# Patient Record
Sex: Female | Born: 1965 | Hispanic: Yes | Marital: Married | Smoking: Never smoker
Health system: Southern US, Community
[De-identification: ages and names within clinical notes are randomized; demographics above are authoritative.]

---

## 2006-08-07 ENCOUNTER — Ambulatory Visit: Payer: Self-pay | Admitting: Obstetrics & Gynecology

## 2006-08-13 ENCOUNTER — Ambulatory Visit: Payer: Self-pay | Admitting: Obstetrics & Gynecology

## 2017-05-20 ENCOUNTER — Ambulatory Visit: Payer: Self-pay | Attending: Oncology

## 2017-11-06 ENCOUNTER — Inpatient Hospital Stay
Admission: RE | Admit: 2017-11-06 | Discharge: 2017-11-06 | Disposition: A | Discharge status: Home / Self Care | Payer: Self-pay | Source: Ambulatory Visit | Attending: *Deleted | Admitting: *Deleted

## 2017-11-06 ENCOUNTER — Other Ambulatory Visit: Payer: Self-pay | Admitting: *Deleted

## 2017-11-06 DIAGNOSIS — Z9289 Personal history of other medical treatment: Secondary | ICD-10-CM

## 2017-12-16 ENCOUNTER — Ambulatory Visit
Admission: RE | Admit: 2017-12-16 | Discharge: 2017-12-16 | Disposition: A | Discharge status: Home / Self Care | Payer: Self-pay | Source: Ambulatory Visit | Attending: Oncology | Admitting: Oncology

## 2017-12-16 ENCOUNTER — Encounter (INDEPENDENT_AMBULATORY_CARE_PROVIDER_SITE_OTHER): Payer: Self-pay

## 2017-12-16 ENCOUNTER — Encounter: Payer: Self-pay | Admitting: *Deleted

## 2017-12-16 ENCOUNTER — Other Ambulatory Visit: Payer: Self-pay

## 2017-12-16 ENCOUNTER — Other Ambulatory Visit: Payer: Self-pay | Admitting: Oncology

## 2017-12-16 ENCOUNTER — Ambulatory Visit: Payer: Self-pay | Attending: Oncology | Admitting: *Deleted

## 2017-12-16 VITALS — BP 160/91 | HR 61 | Temp 98.2°F | Ht 62.0 in | Wt 178.3 lb

## 2017-12-16 DIAGNOSIS — Z0000 Encounter for general adult medical examination without abnormal findings: Secondary | ICD-10-CM

## 2017-12-16 DIAGNOSIS — C50919 Malignant neoplasm of unspecified site of unspecified female breast: Secondary | ICD-10-CM

## 2017-12-16 DIAGNOSIS — R920 Mammographic microcalcification found on diagnostic imaging of breast: Secondary | ICD-10-CM

## 2017-12-16 DIAGNOSIS — N63 Unspecified lump in unspecified breast: Secondary | ICD-10-CM

## 2017-12-16 NOTE — Patient Instructions (Signed)
Prueba del VPH HPV Test La prueba del virus del papiloma humano (VPH) se [place] para Hydrographic surveyor los tipos de infeccin por el VPH de Public affairs consultant. El VPH es un grupo de alrededor de 100 virus. Muchos de estos virus causan tumores dentro de los genitales, sobre ellos o a su alrededor. La mayora de los VPH provocan infecciones que suelen desaparecen sin tratamiento. Sin embargo, los tipos 6, 2, 59 y 33 del VPH se consideran de Public affairs consultant y pueden aumentar el riesgo de Chief Financial Officer de cuello del tero o de ano si la infeccin no se trata. La prueba del VPH identifica las cadenas de ADN (genticas) de la infeccin por el VPH, por lo que tambin se denomina prueba de [place] para [employer]. Aunque el VPH se Secretary/administrator en los hombres como en las mujeres, la prueba del VPH se [place] solo para Hydrographic surveyor un mayor riesgo de cncer en las mujeres:  Con una prueba de Papanicolaou anormal.  Despus del tratamiento de una prueba de Papanicolaou anormal.  Entre 30y 65aos.  Despus del tratamiento de una infeccin por el VPH de [employer].  La prueba del VPH se puede hacer al mismo tiempo que un examen plvico y Ardelia Mems prueba de Papanicolaou en mujeres de ms de 30 aos. Tanto la prueba del VPH como la prueba de Papanicolaou requieren Truddie Coco de clulas del cuello del tero. Cmo debo prepararme para esta prueba?  No se haga lavados vaginales ni se d un bao durante 24 a 48 horas antes de la prueba o como se lo haya indicado el mdico.  No tenga sexo durante 24 a 48 horas antes de la prueba o como se lo haya indicado el mdico.  Es posible que se le pida que reprograme la prueba si est menstruando.  Se le pedir que orine antes de la prueba. [place] significan los resultados? Es su responsabilidad retirar el resultado del [place]. Consulte en el laboratorio o en el departamento en el que fue realizado el estudio cundo y cmo podr [employer]. Hable con el mdico si tiene [employer]. El resultado ser negativo o positivo. Significado de los [employer] del anlisis Un resultado negativo de la prueba del VPH significa que no se detect el VPH, y es muy probable que no tenga el virus. Significado de [employer] positivos del anlisis Un resultado positivo de la prueba del VPH indica que tiene el virus.  Si el resultado de la prueba [place] la presencia de [place] cadena del VPH de alto riesgo, puede tener mayor riesgo de Chief Financial Officer de cuello del tero o de ano si la infeccin no se trata.  Si se encuentran cadenas del VPH de bajo riesgo, es poco probable que tenga un alto riesgo de Chief Financial Officer.  Hable con el mdico [employer]. El mdico [employer] para Optometrist un diagnstico y Teacher, adult education un plan de tratamiento adecuado para usted. Hable con el mdico [employer], las opciones de tratamiento y, si es necesario, la necesidad de Optometrist ms [place]. Hable con el mdico si tiene [employer]. Esta informacin no tiene Marine scientist el consejo del mdico. Asegrese de hacerle al mdico cualquier pregunta que tenga. Document Released: 07/10/2008 Document Revised: 06/12/2016 Document Reviewed: 08/09/2013 Elsevier Interactive Patient Education  2018 [employer].

## 2017-12-16 NOTE — Progress Notes (Signed)
  Subjective:     Patient ID: Anita Romero, female   DOB: [birth year], 52 y.o.   MRN: [id number]  HPI   Review of Systems     Objective:   Physical Exam  Pulmonary/Chest: Right breast exhibits mass. Right breast exhibits no inverted nipple, no nipple discharge, no skin change and no tenderness. Left breast exhibits no inverted nipple, no mass, no nipple discharge, no skin change and no tenderness.    Abdominal: There is no splenomegaly or hepatomegaly.  Genitourinary: No labial fusion. There is no rash, tenderness, lesion or injury on the right labia. There is no rash, tenderness, lesion or injury on the left labia. Cervix exhibits friability. Cervix exhibits no motion tenderness and no discharge. Right adnexum displays no mass, no tenderness and no fullness. Left adnexum displays no mass, no tenderness and no fullness. No erythema, tenderness or bleeding in the vagina. No foreign body in the vagina. No signs of injury around the vagina. No vaginal discharge found.         Assessment:     52 year old Hispanic female referred by [facility] for additional views and ultrasound of bilateral breast.  Recent mammogram at [facility] was a birads 0 showing bilateral calcifications.  Maritza, the interpreter present during the interview and exam.  On clinical breast exam I can palpate an approximate 2 cm mobile soft nodule at 6:00 retroareolar right breast.  Patient states she had noticed the area of concern and had had her husband feel the area also.  Taught self breast awareness.  Last pap on 03/14/15 was negative without HPV co-testing.  Specimen collected for pap smear without difficulty.  Patient has been screened for eligibility.  She does not have any insurance, Medicare or Medicaid.  She also meets financial eligibility.  Hand-out given on the Affordable Care Act. Risk Assessment    Risk Scores      12/16/2017   Last edited by: Theodore Demark, RN   5-year risk: 0.5 %   Lifetime risk: 4 %            Plan:     Bilateral diagnostic mammogram and ultrasound ordered for follow-up of bilateral calcifications and right breast nodule.  Will follow-up per [facility] protocol.

## 2017-12-23 LAB — PAP LB AND HPV HIGH-RISK
HPV, high-risk: NEGATIVE
PAP Smear Comment: 0

## 2017-12-24 ENCOUNTER — Encounter: Payer: Self-pay | Admitting: *Deleted

## 2017-12-24 DIAGNOSIS — R920 Mammographic microcalcification found on diagnostic imaging of breast: Secondary | ICD-10-CM

## 2017-12-24 NOTE — Progress Notes (Addendum)
Called and spoke to patient via Ronnald Collum, the interpreter.  Reviewed results of her normal pap smear and need to return for a 6 month follow up for bilateral calcifications.  Discussed the palpable right breast nodule.   Patient states "It's been there a long time, all the doctors say it's fine".  States it is unchanged.  I have scheduled her to return for a six month follow-up mammogram and breast exam on June 28, 2018 at 8:30 at [facility], and 10:20 at the [facility].   Patient is to call if she notices any changes.  Next pap in 5 years.  HSIS to [place].

## 2018-06-28 ENCOUNTER — Other Ambulatory Visit: Payer: Self-pay

## 2018-10-05 ENCOUNTER — Telehealth: Payer: Self-pay | Admitting: Obstetrics and Gynecology

## 2018-10-05 NOTE — Telephone Encounter (Signed)
Pt came into office lost , Pulled pt chart to help navigate where the pt was supposed to go. Pt did not speak any english as well.

## 2020-08-27 ENCOUNTER — Telehealth: Payer: Self-pay | Admitting: *Deleted

## 2020-08-27 NOTE — Telephone Encounter (Signed)
Patients daughter called stating the patient is requesting to reschedule with [facility] as she feels something under her breast.  The daughter states that if you call the patient [phone]) you will need an interpreter as she does not speak spanish.  If an interpreter is not available, you can call the daughter at [phone] to coordinate scheduling.  Routing to [employer] team for follow up.

## 2020-08-27 NOTE — Telephone Encounter (Signed)
Thank you [name]!  Anita Romero could you call this patient to assess and schedule?  Thank you!!!

## 2020-08-29 ENCOUNTER — Other Ambulatory Visit: Payer: Self-pay

## 2020-08-29 ENCOUNTER — Ambulatory Visit: Payer: Self-pay | Attending: Oncology | Admitting: *Deleted

## 2020-08-29 ENCOUNTER — Encounter: Payer: Self-pay | Admitting: *Deleted

## 2020-08-29 VITALS — BP 160/81 | HR 62 | Temp 97.4°F | Ht 62.0 in | Wt 182.0 lb

## 2020-08-29 DIAGNOSIS — R920 Mammographic microcalcification found on diagnostic imaging of breast: Secondary | ICD-10-CM

## 2020-08-29 NOTE — Progress Notes (Signed)
Subjective:     Patient ID: Anita Romero, female   DOB: June 02, 1965, 55 y.o.   MRN: [id number]  HPI   [facility] Medical History Record - 08/29/20 4098      Breast History   Screening cycle New    CBE Date 08/29/20    Provider ([facility]) [facility]    Initial Mammogram 08/29/20    Last Mammogram Annual    Last Mammogram Date 12/16/17    Recent Breast Symptoms Lump   left axillary inflammation     Breast Cancer History   Breast Cancer History No personal or family history    Comments/Details Patient had aBirads 3 mammogram in 2019, but did not return for follow-up      Previous History of Breast Problems   Breast Surgery or Biopsy None    Breast Implants N/A    BSE Done Monthly      Gynecological/Obstetrical History   LMP --   2020   Is there any chance that the client could be pregnant?  No    Age at menarche 55    Age at menopause 55    PAP smear history Annually    Date of last PAP  12/16/17    Provider (PAP) [facility]  neg/neg    Age at first live birth 38    Breast fed children Yes (type length in comments)   1 year   DES Exposure No    Cervical, Uterine or Ovarian cancer No    Family history of Cervial, Uterine or Ovarian cancer Yes   mother - uterine   Hysterectomy No    Cervix removed No    Ovaries removed No    Laser/Cryosurgery No    Current method of birth control None    Current method of Estrogen/Hormone replacement None    Smoking history None    Comments no insurance             Review of Systems     Objective:   Physical Exam Chest:  Breasts:     Right: No swelling, bleeding, inverted nipple, mass, nipple discharge, skin change, tenderness, axillary adenopathy or supraclavicular adenopathy.     Left: Mass and tenderness present. No swelling, bleeding, inverted nipple, nipple discharge, skin change, axillary adenopathy or supraclavicular adenopathy.     Lymphadenopathy:     Upper Body:     Right upper body: No supraclavicular or axillary adenopathy.      Left upper body: No supraclavicular or axillary adenopathy.        Assessment:     55 year old Hispanic female [facility] to [facility] with complaints of a palpable mass.  Last mammogram on 12/16/17 was a birads 3.  Patient did not return for her 6 month follow up mammogram in March of 2020 at the onset of the Covid 19 Pandemic.   [employer] Interpreter Anita Romero [phone], used for interpretation during the interview and exam.  Patient states she has had intermittent "inflammation" in the left axilla.  States she has had reddness and tenderness 2 months ago and then it came back last week.  States she now has a "tender ball" in the left axilla.  On clinical breast exam there is no erythema, skin changes, nipple discharge or lymphadenopathy.  The patient points to the area of concern in the left axilla, and I can palpate a tiny <0.5cm superficial nodule in the left axilla.  She also complains of pain in the axilla and at the left lateral rib  area.Taught self breast awareness.  Last pap on 12/1117 was negative / negative.  Next pap due in 2024.  Patient has been screened for eligibility.  She does not have any insurance, Medicare or Medicaid.  She also meets financial eligibility.   Risk Assessment   No risk assessment data for the current encounter  Risk Scores      12/16/2017   Last edited by: Theodore Demark, RN   5-year risk: 0.5 %   Lifetime risk: 4 %            Plan:     Will get bilateral diagnostic mammogram and ultrasound to assess previous calcs and new left axilla mass and pain.  Explained to patient that if no findings on imaging, that I would like her to follow up with her PCP to discuss the axillary and left rib pain.  Reviewed that I did not think this to be breast related pain.  She is agreeable to the plan.

## 2020-08-29 NOTE — Patient Instructions (Signed)
Gave patient hand-out, Women Staying Healthy, Active and Well from [facility], with education on breast health, pap smears, heart and colon health. 

## 2020-08-31 ENCOUNTER — Ambulatory Visit
Admission: RE | Admit: 2020-08-31 | Discharge: 2020-08-31 | Disposition: A | Discharge status: Home / Self Care | Payer: Self-pay | Source: Ambulatory Visit | Attending: Oncology | Admitting: Oncology

## 2020-08-31 ENCOUNTER — Other Ambulatory Visit: Payer: Self-pay

## 2020-08-31 DIAGNOSIS — R920 Mammographic microcalcification found on diagnostic imaging of breast: Secondary | ICD-10-CM

## 2020-09-05 ENCOUNTER — Encounter: Payer: Self-pay | Admitting: *Deleted

## 2020-09-05 NOTE — Progress Notes (Signed)
Letter mailed from [facility] to inform patient of her normal mammogram results.  Patient is to follow-up with annual screening in one year.  Patient to follow up with her PCP as recommended to assess the left lateral rib pain.

## 2021-11-11 ENCOUNTER — Other Ambulatory Visit: Payer: Self-pay

## 2021-11-11 ENCOUNTER — Ambulatory Visit
Admission: EM | Admit: 2021-11-11 | Discharge: 2021-11-11 | Disposition: A | Discharge status: Home / Self Care | Payer: Self-pay | Attending: Emergency Medicine | Admitting: Emergency Medicine

## 2021-11-11 ENCOUNTER — Encounter: Payer: Self-pay | Admitting: Emergency Medicine

## 2021-11-11 DIAGNOSIS — S161XXA Strain of muscle, fascia and tendon at neck level, initial encounter: Secondary | ICD-10-CM

## 2021-11-11 MED ORDER — BACLOFEN 10 MG PO TABS: 10.0000 mg | ORAL_TABLET | Freq: Three times a day (TID) | ORAL | 0 refills | Status: AC

## 2021-11-11 MED ORDER — IBUPROFEN 600 MG PO TABS: 600.0000 mg | ORAL_TABLET | Freq: Four times a day (QID) | ORAL | 0 refills | Status: AC | PRN
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
## 2021-11-11 NOTE — Discharge Instructions (Addendum)
Take the ibuprofen, 600 mg every 6 hours with food, on a schedule for the next 48 hours and then as needed.  Take the baclofen, 10 mg every 8 hours, on a schedule for the next 48 hours and then as needed.  Apply moist heat to your neck for 30 minutes at a time 2-3 times a day to improve blood flow to the area and help remove the lactic acid causing the spasm.  Follow the neck exercises given at discharge.  Call [facility] in [place] and schedule an appointment to be evaluated for your blood pressure since you have been seen there before.   Return for reevaluation for any new or worsening symptoms.
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
## 2022-08-06 ENCOUNTER — Other Ambulatory Visit: Payer: Self-pay | Admitting: Primary Care

## 2022-08-06 DIAGNOSIS — Z1231 Encounter for screening mammogram for malignant neoplasm of breast: Secondary | ICD-10-CM

## 2022-09-29 ENCOUNTER — Other Ambulatory Visit (INDEPENDENT_AMBULATORY_CARE_PROVIDER_SITE_OTHER): Payer: Self-pay | Admitting: Nurse Practitioner

## 2022-09-29 DIAGNOSIS — I83813 Varicose veins of bilateral lower extremities with pain: Secondary | ICD-10-CM

## 2022-10-03 ENCOUNTER — Encounter (INDEPENDENT_AMBULATORY_CARE_PROVIDER_SITE_OTHER): Payer: BLUE CROSS/BLUE SHIELD | Admitting: Vascular Surgery

## 2022-10-03 ENCOUNTER — Ambulatory Visit (INDEPENDENT_AMBULATORY_CARE_PROVIDER_SITE_OTHER): Payer: Self-pay

## 2022-10-03 ENCOUNTER — Encounter (INDEPENDENT_AMBULATORY_CARE_PROVIDER_SITE_OTHER): Payer: Self-pay

## 2023-03-08 IMAGING — US US BREAST*R* LIMITED INC AXILLA
1 series · 12 of 12 positions shown · non-contrast
Comparison: Previous exam(s).

CLINICAL DATA: Greater than 2 year follow-up of bilateral breast
calcifications. Tenderness in the left axilla.



[Series 1: us breast*right* limited inc axilla · 0.07mm/px · 12 of 12 slices shown]
[im 1/12]
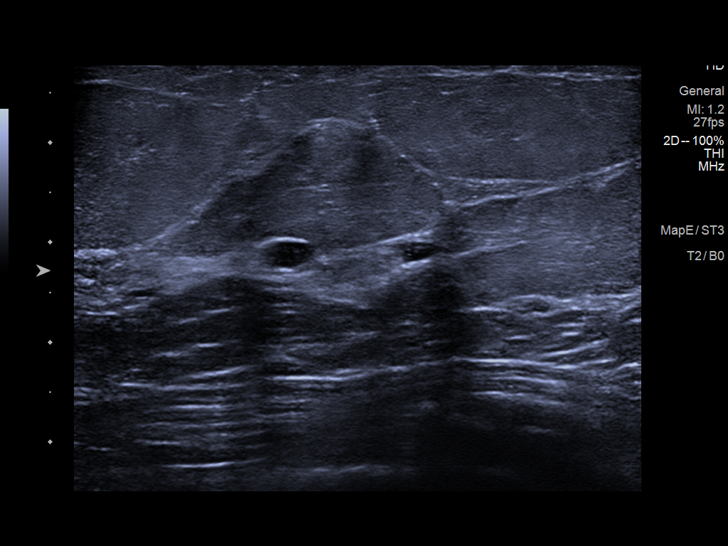
[im 2/12]
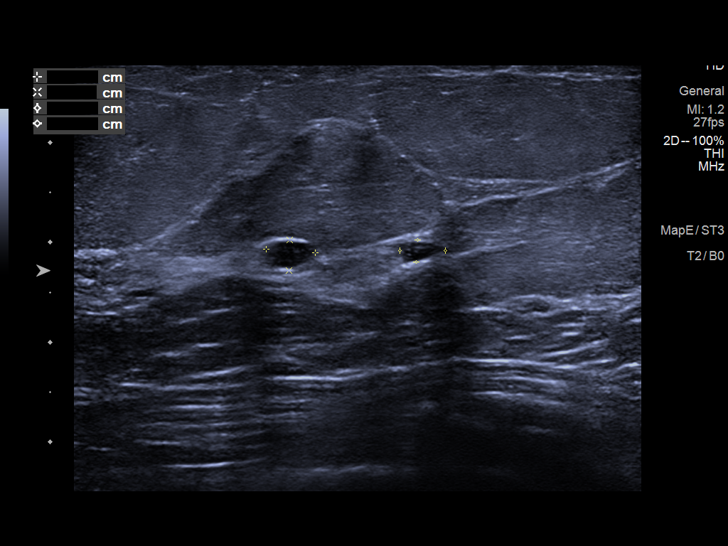
[im 3/12]
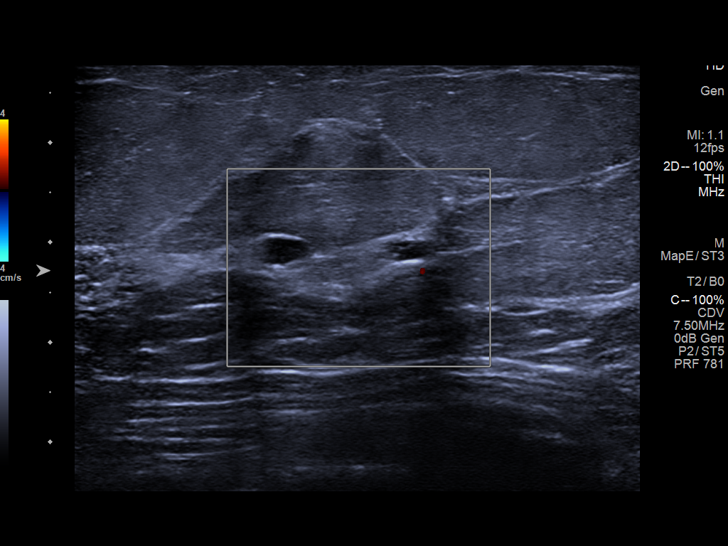
[im 4/12]
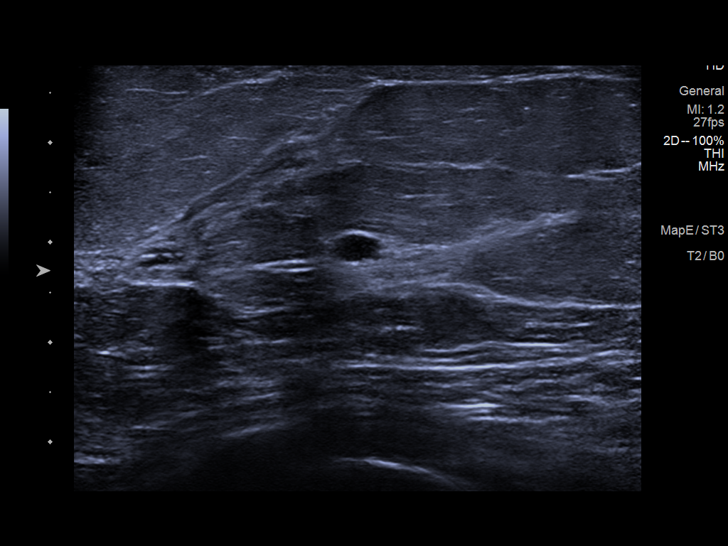
[im 5/12]
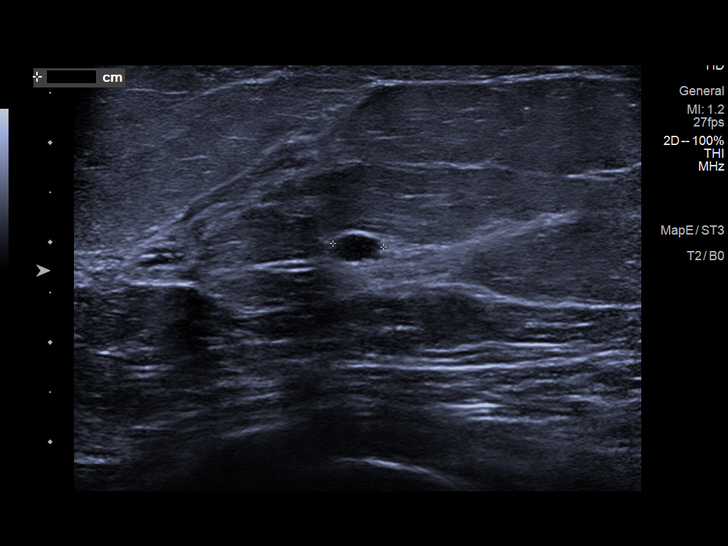
[im 6/12]
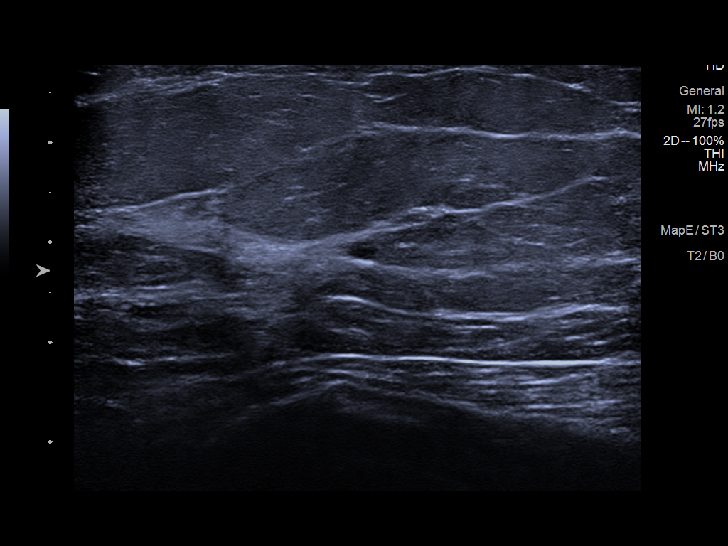
[im 7/12]
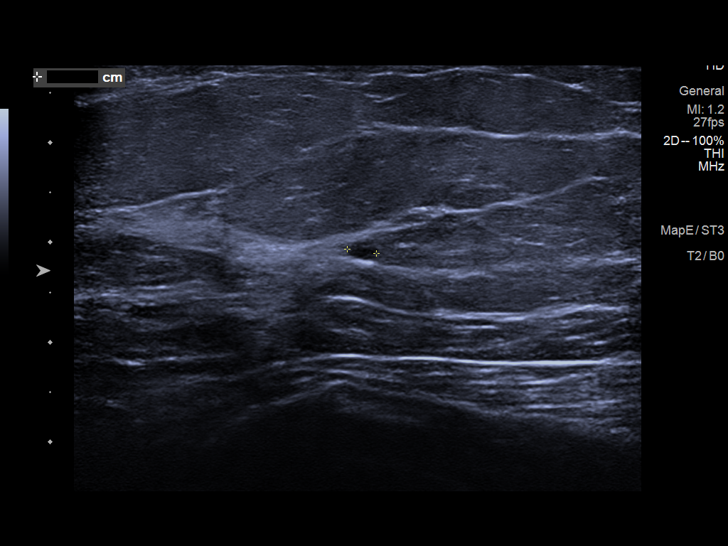
[im 8/12]
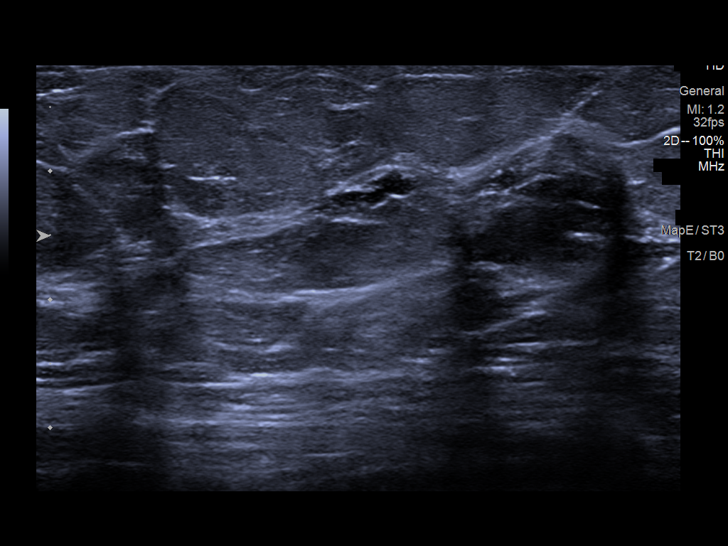
[im 9/12]
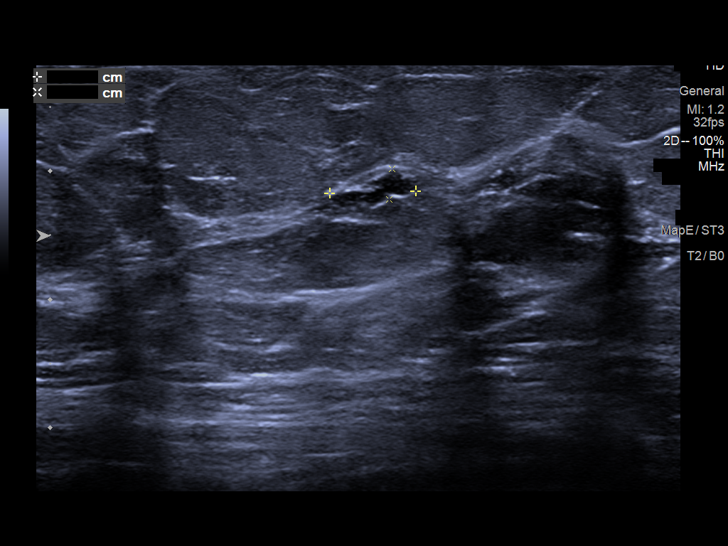
[im 10/12]
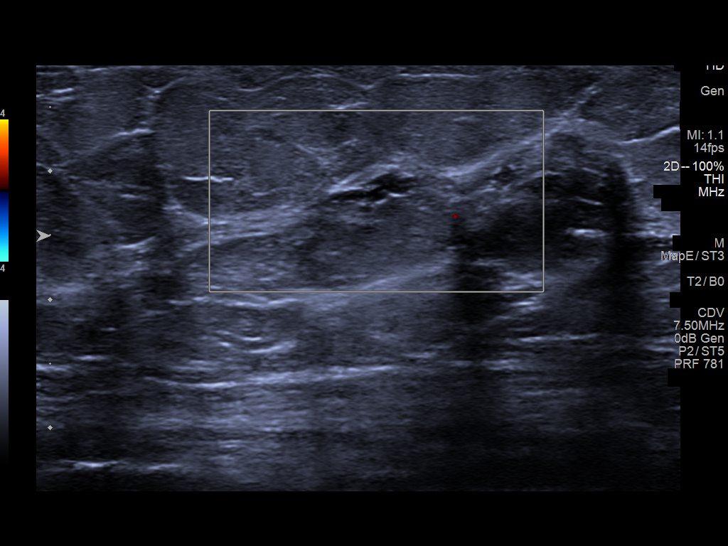
[im 11/12]
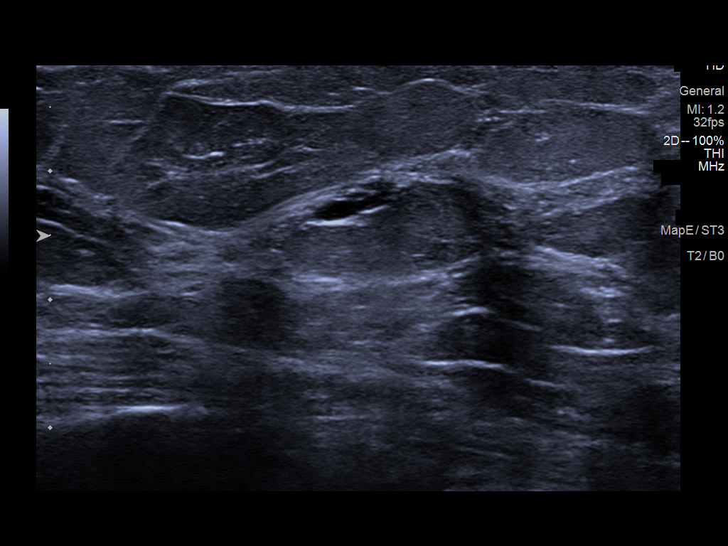
[im 12/12]
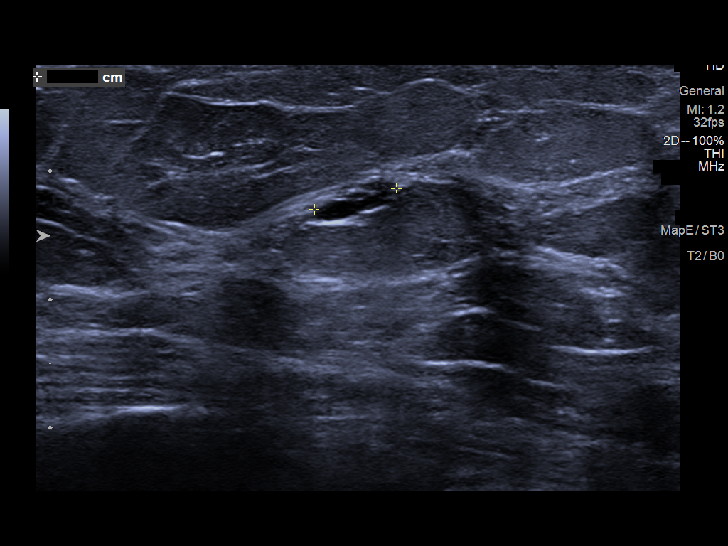

[12 of 12 positions shown; findings below may reference images not displayed]

ACR Breast Density Category b: There are scattered areas of
fibroglandular density.
FINDINGS: Bilateral breast calcifications are stable, now considered benign.
Multiple bilateral breast masses are consistent with a benign
process such as fibrocystic change. No mammographic evidence of
malignancy.

On physical exam, no suspicious lumps are identified.

Targeted ultrasound is performed, showing fibrocystic changes
bilaterally. No suspicious findings seen in either breast.
Specifically, no abnormality seen in the region of the left axillary
tenderness.
IMPRESSION: No mammographic or sonographic evidence of malignancy.

RECOMMENDATION:
Treatment of the patient's symptoms should be based on clinical and
physical exam given lack of imaging findings. Recommend annual
screening mammography.

I have discussed the findings and recommendations with the patient.
If applicable, a reminder letter will be sent to the patient
regarding the next appointment.

BI-RADS CATEGORY  2: Benign.

## 2023-03-08 IMAGING — MG DIGITAL DIAGNOSTIC BILAT W/ TOMO W/ CAD
8 of 18 series · 8 of 40 positions shown · non-contrast
Comparison: Previous exam(s).

CLINICAL DATA: Greater than 2 year follow-up of bilateral breast
calcifications. Tenderness in the left axilla.



[L ML]
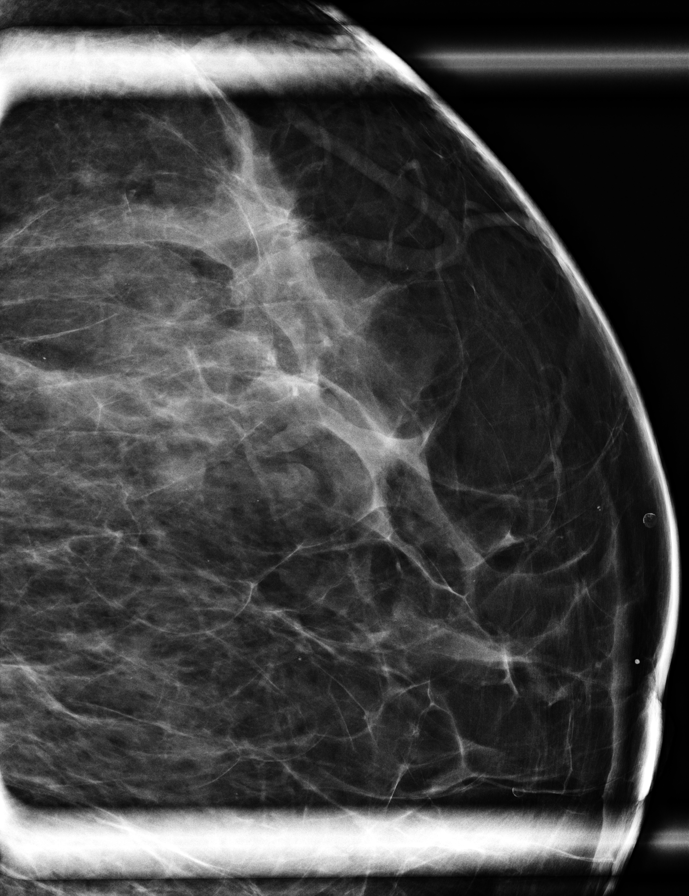

[R CC]
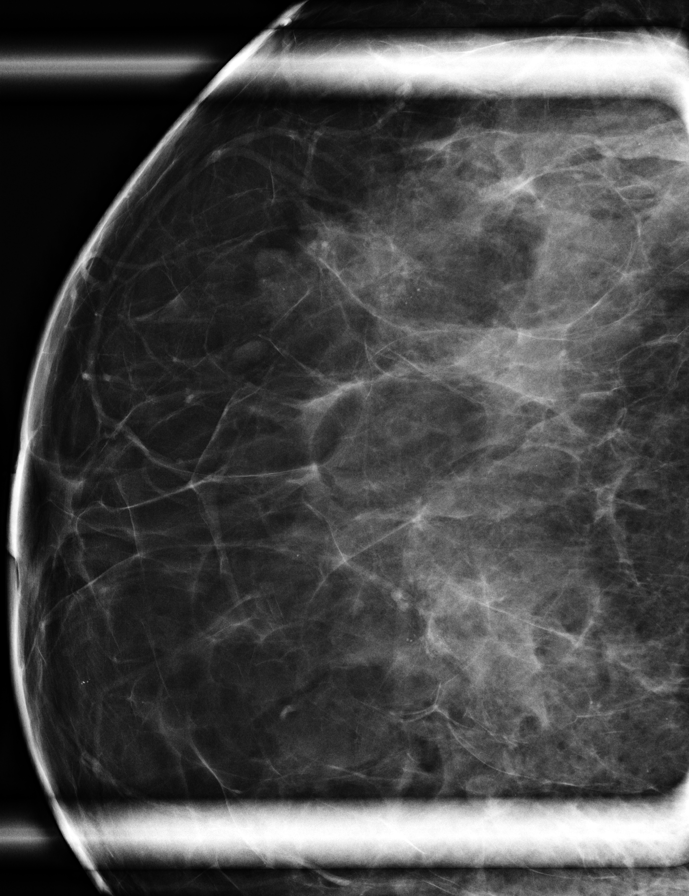

[L CC]
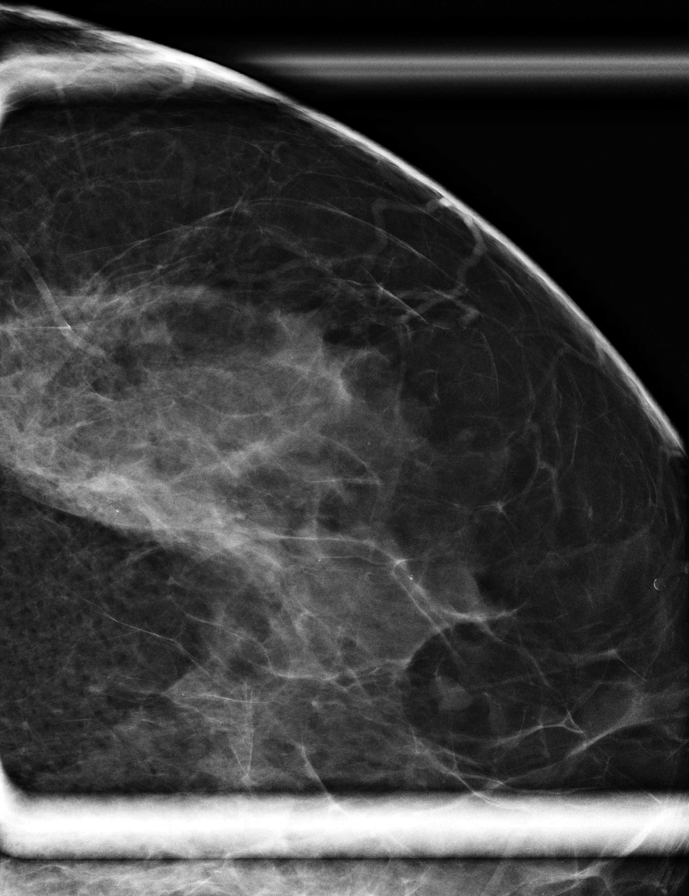

[R ML]
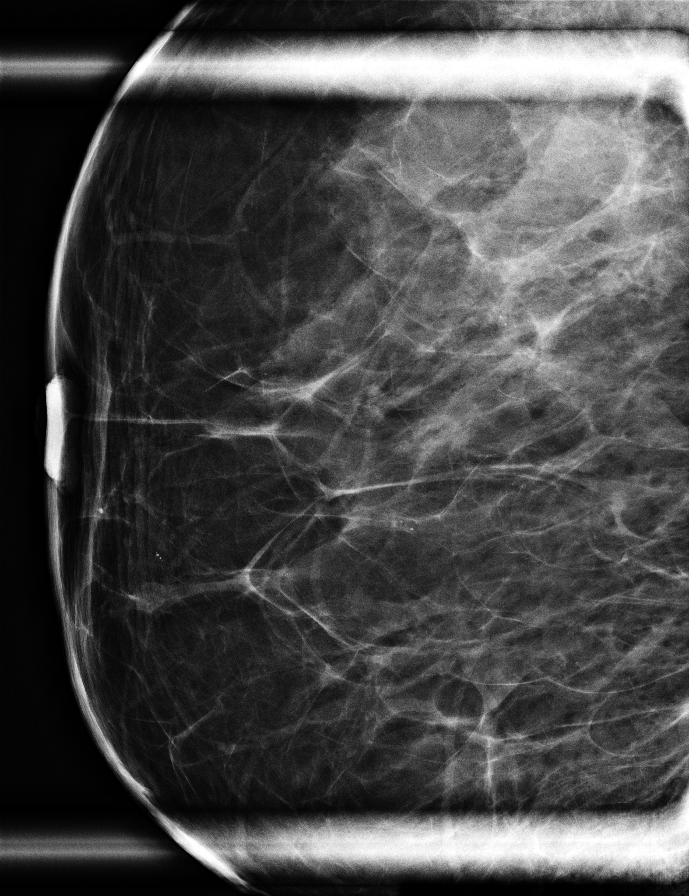

[L CC synth-2D]
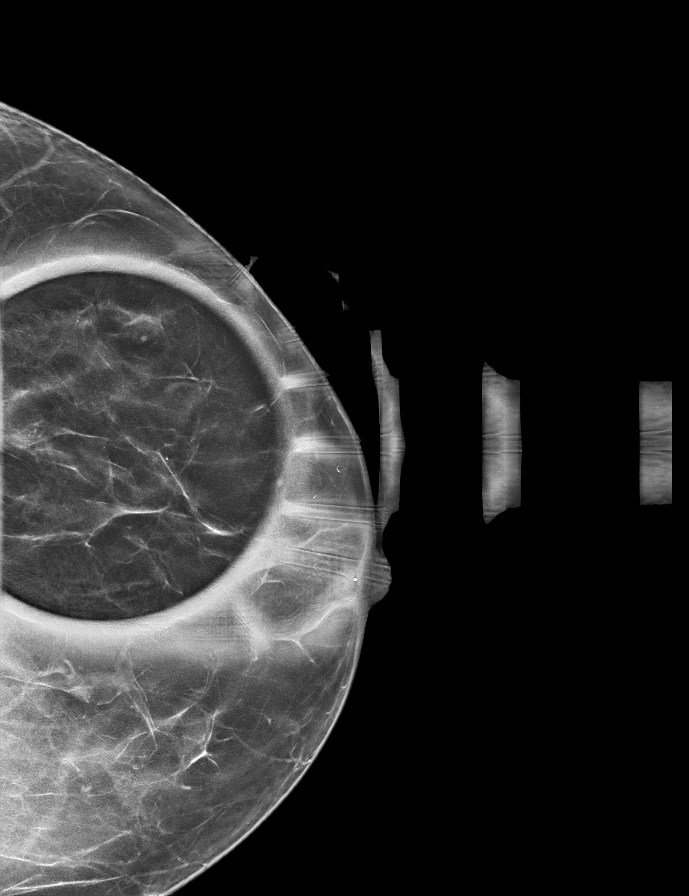

[L MLO synth-2D]
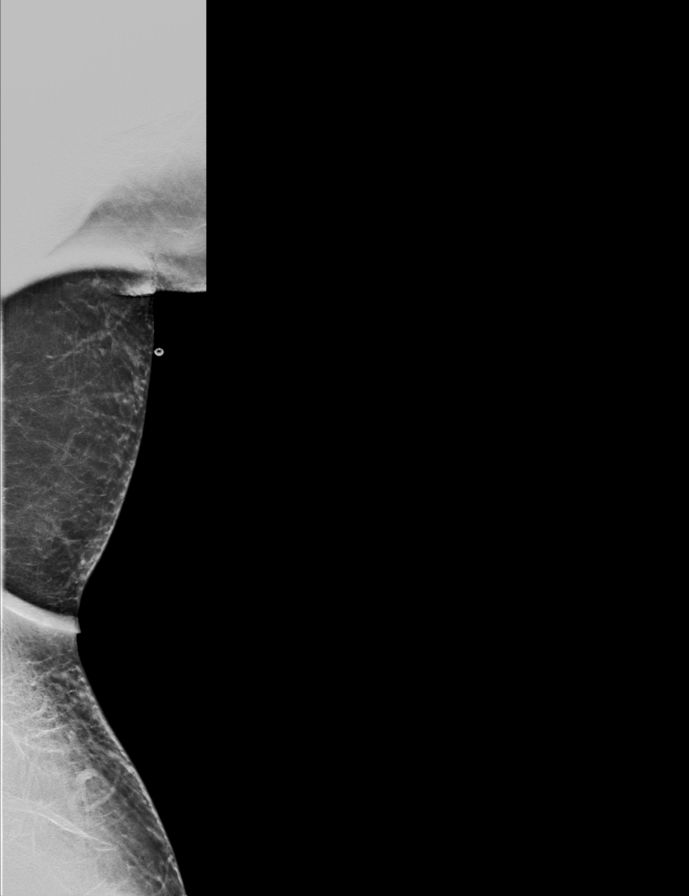

[R CC synth-2D]
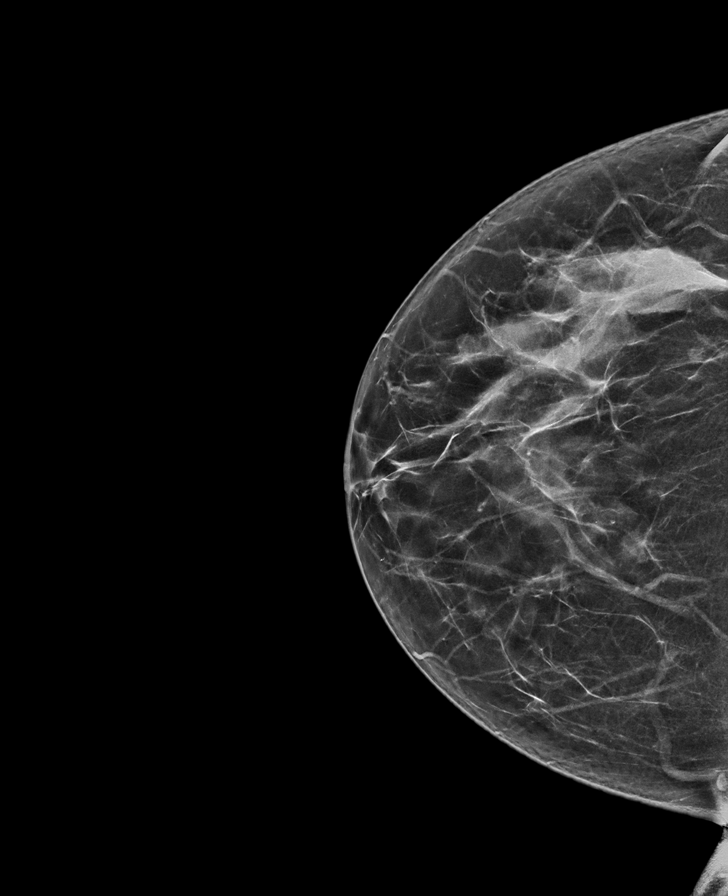

[R MLO synth-2D]
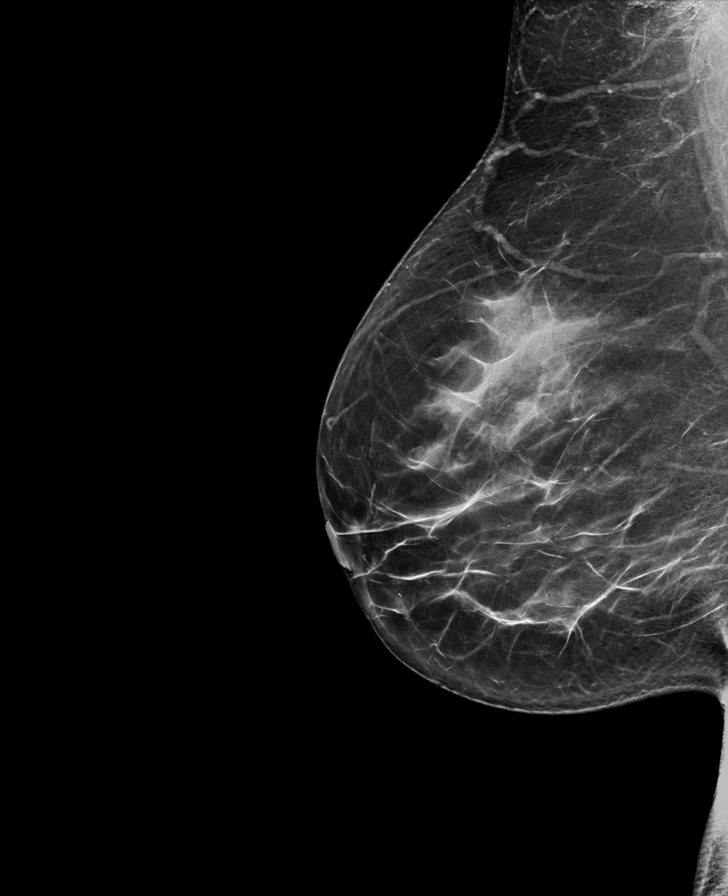

[8 of 40 positions shown; findings below may reference images not displayed]

ACR Breast Density Category b: There are scattered areas of
fibroglandular density.
FINDINGS: Bilateral breast calcifications are stable, now considered benign.
Multiple bilateral breast masses are consistent with a benign
process such as fibrocystic change. No mammographic evidence of
malignancy.

On physical exam, no suspicious lumps are identified.

Targeted ultrasound is performed, showing fibrocystic changes
bilaterally. No suspicious findings seen in either breast.
Specifically, no abnormality seen in the region of the left axillary
tenderness.
IMPRESSION: No mammographic or sonographic evidence of malignancy.

RECOMMENDATION:
Treatment of the patient's symptoms should be based on clinical and
physical exam given lack of imaging findings. Recommend annual
screening mammography.

I have discussed the findings and recommendations with the patient.
If applicable, a reminder letter will be sent to the patient
regarding the next appointment.

BI-RADS CATEGORY  2: Benign.

## 2023-08-25 ENCOUNTER — Telehealth: Payer: Self-pay | Admitting: *Deleted

## 2023-10-29 ENCOUNTER — Telehealth: Payer: Self-pay | Admitting: *Deleted
